# Patient Record
Sex: Female | Born: 1970
Health system: Southern US, Community
[De-identification: ages and names within clinical notes are randomized; demographics above are authoritative.]

---

## 1998-06-08 ENCOUNTER — Ambulatory Visit (HOSPITAL_COMMUNITY): Admission: RE | Admit: 1998-06-08 | Discharge: 1998-06-08 | Payer: Self-pay | Admitting: Family Medicine

## 1999-05-25 ENCOUNTER — Other Ambulatory Visit: Admission: RE | Admit: 1999-05-25 | Discharge: 1999-05-25 | Payer: Self-pay | Admitting: *Deleted

## 2000-05-28 ENCOUNTER — Other Ambulatory Visit: Admission: RE | Admit: 2000-05-28 | Discharge: 2000-05-28 | Payer: Self-pay | Admitting: *Deleted

## 2001-06-11 ENCOUNTER — Other Ambulatory Visit: Admission: RE | Admit: 2001-06-11 | Discharge: 2001-06-11 | Payer: Self-pay | Admitting: *Deleted

## 2001-08-15 ENCOUNTER — Ambulatory Visit (HOSPITAL_COMMUNITY): Admission: RE | Admit: 2001-08-15 | Discharge: 2001-08-15 | Payer: Self-pay | Admitting: Obstetrics and Gynecology

## 2001-08-15 ENCOUNTER — Encounter: Payer: Self-pay | Admitting: Obstetrics and Gynecology

## 2002-07-21 ENCOUNTER — Encounter: Payer: Self-pay | Admitting: *Deleted

## 2002-07-21 ENCOUNTER — Encounter: Admission: RE | Admit: 2002-07-21 | Discharge: 2002-07-21 | Payer: Self-pay | Admitting: *Deleted

## 2003-06-17 ENCOUNTER — Other Ambulatory Visit: Admission: RE | Admit: 2003-06-17 | Discharge: 2003-06-17 | Payer: Self-pay | Admitting: *Deleted

## 2003-10-01 ENCOUNTER — Emergency Department (HOSPITAL_COMMUNITY): Admission: EM | Admit: 2003-10-01 | Discharge: 2003-10-01 | Payer: Self-pay | Admitting: Emergency Medicine

## 2003-11-30 ENCOUNTER — Ambulatory Visit (HOSPITAL_COMMUNITY): Admission: RE | Admit: 2003-11-30 | Discharge: 2003-11-30 | Payer: Self-pay | Admitting: Neurology

## 2004-07-12 ENCOUNTER — Other Ambulatory Visit: Admission: RE | Admit: 2004-07-12 | Discharge: 2004-07-12 | Payer: Self-pay | Admitting: *Deleted

## 2005-07-25 ENCOUNTER — Other Ambulatory Visit: Admission: RE | Admit: 2005-07-25 | Discharge: 2005-07-25 | Payer: Self-pay | Admitting: Family Medicine

## 2006-09-05 ENCOUNTER — Other Ambulatory Visit: Admission: RE | Admit: 2006-09-05 | Discharge: 2006-09-05 | Payer: Self-pay | Admitting: Family Medicine

## 2007-09-15 ENCOUNTER — Other Ambulatory Visit: Admission: RE | Admit: 2007-09-15 | Discharge: 2007-09-15 | Payer: Self-pay | Admitting: Family Medicine

## 2007-10-19 ENCOUNTER — Encounter: Admission: RE | Admit: 2007-10-19 | Discharge: 2007-10-19 | Payer: Self-pay | Admitting: Neurology

## 2008-09-23 ENCOUNTER — Other Ambulatory Visit: Admission: RE | Admit: 2008-09-23 | Discharge: 2008-09-23 | Payer: Self-pay | Admitting: Family Medicine

## 2009-10-04 ENCOUNTER — Other Ambulatory Visit: Admission: RE | Admit: 2009-10-04 | Discharge: 2009-10-04 | Payer: Self-pay | Admitting: Family Medicine

## 2010-01-07 ENCOUNTER — Ambulatory Visit (HOSPITAL_BASED_OUTPATIENT_CLINIC_OR_DEPARTMENT_OTHER): Admission: RE | Admit: 2010-01-07 | Discharge: 2010-01-07 | Payer: Self-pay | Admitting: Neurology

## 2010-01-07 ENCOUNTER — Ambulatory Visit: Payer: Self-pay | Admitting: Diagnostic Radiology

## 2010-09-20 ENCOUNTER — Other Ambulatory Visit: Payer: Self-pay | Admitting: Dermatology

## 2011-10-16 ENCOUNTER — Other Ambulatory Visit: Payer: Self-pay | Admitting: Family Medicine

## 2011-10-16 ENCOUNTER — Other Ambulatory Visit (HOSPITAL_COMMUNITY)
Admission: RE | Admit: 2011-10-16 | Discharge: 2011-10-16 | Disposition: A | Discharge status: Home / Self Care | Payer: BC Managed Care – PPO | Source: Ambulatory Visit | Attending: Family Medicine | Admitting: Family Medicine

## 2011-10-16 DIAGNOSIS — Z124 Encounter for screening for malignant neoplasm of cervix: Secondary | ICD-10-CM | POA: Insufficient documentation

## 2011-10-16 DIAGNOSIS — Z1151 Encounter for screening for human papillomavirus (HPV): Secondary | ICD-10-CM | POA: Insufficient documentation

## 2012-09-01 ENCOUNTER — Other Ambulatory Visit: Payer: Self-pay | Admitting: Dermatology

## 2012-11-06 ENCOUNTER — Other Ambulatory Visit: Payer: Self-pay | Admitting: Dermatology

## 2013-05-11 ENCOUNTER — Other Ambulatory Visit: Payer: Self-pay

## 2013-05-11 DIAGNOSIS — Z1231 Encounter for screening mammogram for malignant neoplasm of breast: Secondary | ICD-10-CM

## 2013-05-29 ENCOUNTER — Ambulatory Visit
Admission: RE | Admit: 2013-05-29 | Discharge: 2013-05-29 | Disposition: A | Discharge status: Home / Self Care | Payer: BC Managed Care – PPO | Source: Ambulatory Visit

## 2013-05-29 DIAGNOSIS — Z1231 Encounter for screening mammogram for malignant neoplasm of breast: Secondary | ICD-10-CM

## 2014-07-20 ENCOUNTER — Other Ambulatory Visit: Payer: Self-pay

## 2014-07-20 DIAGNOSIS — Z1231 Encounter for screening mammogram for malignant neoplasm of breast: Secondary | ICD-10-CM

## 2014-07-28 ENCOUNTER — Ambulatory Visit
Admission: RE | Admit: 2014-07-28 | Discharge: 2014-07-28 | Disposition: A | Discharge status: Home / Self Care | Payer: BLUE CROSS/BLUE SHIELD | Source: Ambulatory Visit

## 2014-07-28 DIAGNOSIS — Z1231 Encounter for screening mammogram for malignant neoplasm of breast: Secondary | ICD-10-CM

## 2015-06-20 ENCOUNTER — Other Ambulatory Visit: Payer: Self-pay

## 2015-06-20 DIAGNOSIS — Z1231 Encounter for screening mammogram for malignant neoplasm of breast: Secondary | ICD-10-CM

## 2015-07-29 ENCOUNTER — Ambulatory Visit
Admission: RE | Admit: 2015-07-29 | Discharge: 2015-07-29 | Disposition: A | Discharge status: Home / Self Care | Payer: BLUE CROSS/BLUE SHIELD | Source: Ambulatory Visit

## 2015-07-29 DIAGNOSIS — Z1231 Encounter for screening mammogram for malignant neoplasm of breast: Secondary | ICD-10-CM | POA: Diagnosis not present

## 2015-08-02 ENCOUNTER — Other Ambulatory Visit: Payer: Self-pay | Admitting: Family Medicine

## 2015-08-02 DIAGNOSIS — R928 Other abnormal and inconclusive findings on diagnostic imaging of breast: Secondary | ICD-10-CM

## 2015-08-10 ENCOUNTER — Ambulatory Visit
Admission: RE | Admit: 2015-08-10 | Discharge: 2015-08-10 | Disposition: A | Discharge status: Home / Self Care | Payer: BLUE CROSS/BLUE SHIELD | Source: Ambulatory Visit | Attending: Family Medicine | Admitting: Family Medicine

## 2015-08-10 DIAGNOSIS — R928 Other abnormal and inconclusive findings on diagnostic imaging of breast: Secondary | ICD-10-CM

## 2015-08-10 DIAGNOSIS — F430 Acute stress reaction: Secondary | ICD-10-CM | POA: Diagnosis not present

## 2015-09-07 DIAGNOSIS — R42 Dizziness and giddiness: Secondary | ICD-10-CM | POA: Diagnosis not present

## 2015-09-27 DIAGNOSIS — C713 Malignant neoplasm of parietal lobe: Secondary | ICD-10-CM | POA: Diagnosis not present

## 2015-10-03 DIAGNOSIS — C713 Malignant neoplasm of parietal lobe: Secondary | ICD-10-CM | POA: Diagnosis not present

## 2016-01-25 DIAGNOSIS — H5213 Myopia, bilateral: Secondary | ICD-10-CM | POA: Diagnosis not present

## 2016-02-07 DIAGNOSIS — E559 Vitamin D deficiency, unspecified: Secondary | ICD-10-CM | POA: Diagnosis not present

## 2016-02-07 DIAGNOSIS — Z0000 Encounter for general adult medical examination without abnormal findings: Secondary | ICD-10-CM | POA: Diagnosis not present

## 2016-02-15 DIAGNOSIS — Z0000 Encounter for general adult medical examination without abnormal findings: Secondary | ICD-10-CM | POA: Diagnosis not present

## 2016-02-29 DIAGNOSIS — D2261 Melanocytic nevi of right upper limb, including shoulder: Secondary | ICD-10-CM | POA: Diagnosis not present

## 2016-02-29 DIAGNOSIS — L821 Other seborrheic keratosis: Secondary | ICD-10-CM | POA: Diagnosis not present

## 2016-02-29 DIAGNOSIS — D225 Melanocytic nevi of trunk: Secondary | ICD-10-CM | POA: Diagnosis not present

## 2016-02-29 DIAGNOSIS — D224 Melanocytic nevi of scalp and neck: Secondary | ICD-10-CM | POA: Diagnosis not present

## 2016-05-14 DIAGNOSIS — L650 Telogen effluvium: Secondary | ICD-10-CM | POA: Diagnosis not present

## 2016-05-14 DIAGNOSIS — E559 Vitamin D deficiency, unspecified: Secondary | ICD-10-CM | POA: Diagnosis not present

## 2016-08-01 DIAGNOSIS — R350 Frequency of micturition: Secondary | ICD-10-CM | POA: Diagnosis not present

## 2016-08-13 ENCOUNTER — Other Ambulatory Visit: Payer: Self-pay | Admitting: Family Medicine

## 2016-08-13 DIAGNOSIS — Z1231 Encounter for screening mammogram for malignant neoplasm of breast: Secondary | ICD-10-CM

## 2016-08-24 ENCOUNTER — Ambulatory Visit
Admission: RE | Admit: 2016-08-24 | Discharge: 2016-08-24 | Disposition: A | Discharge status: Home / Self Care | Payer: BLUE CROSS/BLUE SHIELD | Source: Ambulatory Visit | Attending: Family Medicine | Admitting: Family Medicine

## 2016-08-24 DIAGNOSIS — Z1231 Encounter for screening mammogram for malignant neoplasm of breast: Secondary | ICD-10-CM

## 2016-09-17 DIAGNOSIS — C713 Malignant neoplasm of parietal lobe: Secondary | ICD-10-CM | POA: Diagnosis not present

## 2016-10-01 DIAGNOSIS — C713 Malignant neoplasm of parietal lobe: Secondary | ICD-10-CM | POA: Diagnosis not present

## 2016-11-09 DIAGNOSIS — N390 Urinary tract infection, site not specified: Secondary | ICD-10-CM | POA: Diagnosis not present

## 2016-11-09 DIAGNOSIS — R300 Dysuria: Secondary | ICD-10-CM | POA: Diagnosis not present

## 2016-11-15 DIAGNOSIS — R350 Frequency of micturition: Secondary | ICD-10-CM | POA: Diagnosis not present

## 2017-01-08 DIAGNOSIS — R109 Unspecified abdominal pain: Secondary | ICD-10-CM | POA: Diagnosis not present

## 2017-01-08 DIAGNOSIS — R110 Nausea: Secondary | ICD-10-CM | POA: Diagnosis not present

## 2017-02-04 DIAGNOSIS — H5213 Myopia, bilateral: Secondary | ICD-10-CM | POA: Diagnosis not present

## 2017-03-15 DIAGNOSIS — R0609 Other forms of dyspnea: Secondary | ICD-10-CM | POA: Diagnosis not present

## 2017-03-15 DIAGNOSIS — R1012 Left upper quadrant pain: Secondary | ICD-10-CM | POA: Diagnosis not present

## 2017-03-18 ENCOUNTER — Telehealth: Payer: Self-pay

## 2017-03-18 NOTE — Telephone Encounter (Signed)
NOTES ON FILE 

## 2017-03-19 ENCOUNTER — Telehealth: Payer: Self-pay

## 2017-03-19 NOTE — Telephone Encounter (Signed)
SENT REFERRAL TO SCHEDULING 

## 2017-04-15 ENCOUNTER — Other Ambulatory Visit: Payer: Self-pay | Admitting: Family Medicine

## 2017-04-15 ENCOUNTER — Other Ambulatory Visit (HOSPITAL_COMMUNITY)
Admission: RE | Admit: 2017-04-15 | Discharge: 2017-04-15 | Disposition: A | Discharge status: Home / Self Care | Payer: BLUE CROSS/BLUE SHIELD | Source: Ambulatory Visit | Attending: Family Medicine | Admitting: Family Medicine

## 2017-04-15 DIAGNOSIS — E559 Vitamin D deficiency, unspecified: Secondary | ICD-10-CM | POA: Diagnosis not present

## 2017-04-15 DIAGNOSIS — Z124 Encounter for screening for malignant neoplasm of cervix: Secondary | ICD-10-CM | POA: Diagnosis not present

## 2017-04-15 DIAGNOSIS — Z0000 Encounter for general adult medical examination without abnormal findings: Secondary | ICD-10-CM | POA: Diagnosis not present

## 2017-04-15 DIAGNOSIS — R790 Abnormal level of blood mineral: Secondary | ICD-10-CM | POA: Diagnosis not present

## 2017-04-15 DIAGNOSIS — D473 Essential (hemorrhagic) thrombocythemia: Secondary | ICD-10-CM | POA: Diagnosis not present

## 2017-04-17 LAB — CYTOLOGY - PAP
Diagnosis: NEGATIVE
HPV (WINDOPATH): NOT DETECTED

## 2017-05-01 DIAGNOSIS — D225 Melanocytic nevi of trunk: Secondary | ICD-10-CM | POA: Diagnosis not present

## 2017-05-01 DIAGNOSIS — D2262 Melanocytic nevi of left upper limb, including shoulder: Secondary | ICD-10-CM | POA: Diagnosis not present

## 2017-05-01 DIAGNOSIS — L821 Other seborrheic keratosis: Secondary | ICD-10-CM | POA: Diagnosis not present

## 2017-05-01 DIAGNOSIS — L738 Other specified follicular disorders: Secondary | ICD-10-CM | POA: Diagnosis not present

## 2017-05-02 ENCOUNTER — Ambulatory Visit: Payer: BLUE CROSS/BLUE SHIELD | Admitting: Cardiology

## 2017-06-21 DIAGNOSIS — K648 Other hemorrhoids: Secondary | ICD-10-CM | POA: Diagnosis not present

## 2017-06-21 DIAGNOSIS — N3001 Acute cystitis with hematuria: Secondary | ICD-10-CM | POA: Diagnosis not present

## 2017-06-21 DIAGNOSIS — R300 Dysuria: Secondary | ICD-10-CM | POA: Diagnosis not present

## 2017-06-21 DIAGNOSIS — R350 Frequency of micturition: Secondary | ICD-10-CM | POA: Diagnosis not present

## 2017-08-26 ENCOUNTER — Other Ambulatory Visit: Payer: Self-pay | Admitting: Family Medicine

## 2017-08-26 DIAGNOSIS — Z1231 Encounter for screening mammogram for malignant neoplasm of breast: Secondary | ICD-10-CM

## 2017-09-20 DIAGNOSIS — C713 Malignant neoplasm of parietal lobe: Secondary | ICD-10-CM | POA: Diagnosis not present

## 2017-09-30 DIAGNOSIS — C713 Malignant neoplasm of parietal lobe: Secondary | ICD-10-CM | POA: Diagnosis not present

## 2017-10-07 ENCOUNTER — Ambulatory Visit: Payer: BLUE CROSS/BLUE SHIELD

## 2017-10-30 ENCOUNTER — Ambulatory Visit
Admission: RE | Admit: 2017-10-30 | Discharge: 2017-10-30 | Disposition: A | Discharge status: Home / Self Care | Payer: BLUE CROSS/BLUE SHIELD | Source: Ambulatory Visit | Attending: Family Medicine | Admitting: Family Medicine

## 2017-10-30 DIAGNOSIS — Z1231 Encounter for screening mammogram for malignant neoplasm of breast: Secondary | ICD-10-CM | POA: Diagnosis not present

## 2018-01-28 DIAGNOSIS — R300 Dysuria: Secondary | ICD-10-CM | POA: Diagnosis not present

## 2018-02-19 DIAGNOSIS — H5213 Myopia, bilateral: Secondary | ICD-10-CM | POA: Diagnosis not present

## 2018-05-05 DIAGNOSIS — E559 Vitamin D deficiency, unspecified: Secondary | ICD-10-CM | POA: Diagnosis not present

## 2018-05-05 DIAGNOSIS — Z131 Encounter for screening for diabetes mellitus: Secondary | ICD-10-CM | POA: Diagnosis not present

## 2018-05-05 DIAGNOSIS — R790 Abnormal level of blood mineral: Secondary | ICD-10-CM | POA: Diagnosis not present

## 2018-05-05 DIAGNOSIS — D473 Essential (hemorrhagic) thrombocythemia: Secondary | ICD-10-CM | POA: Diagnosis not present

## 2018-05-05 DIAGNOSIS — Z13220 Encounter for screening for lipoid disorders: Secondary | ICD-10-CM | POA: Diagnosis not present

## 2018-05-05 DIAGNOSIS — Z0000 Encounter for general adult medical examination without abnormal findings: Secondary | ICD-10-CM | POA: Diagnosis not present

## 2018-09-25 DIAGNOSIS — C713 Malignant neoplasm of parietal lobe: Secondary | ICD-10-CM | POA: Diagnosis not present

## 2018-09-29 DIAGNOSIS — C713 Malignant neoplasm of parietal lobe: Secondary | ICD-10-CM | POA: Diagnosis not present

## 2018-11-24 ENCOUNTER — Other Ambulatory Visit: Payer: Self-pay | Admitting: Family Medicine

## 2018-11-24 DIAGNOSIS — Z1231 Encounter for screening mammogram for malignant neoplasm of breast: Secondary | ICD-10-CM

## 2018-12-21 DIAGNOSIS — N390 Urinary tract infection, site not specified: Secondary | ICD-10-CM | POA: Diagnosis not present

## 2018-12-30 DIAGNOSIS — R300 Dysuria: Secondary | ICD-10-CM | POA: Diagnosis not present

## 2018-12-30 DIAGNOSIS — N390 Urinary tract infection, site not specified: Secondary | ICD-10-CM | POA: Diagnosis not present

## 2019-01-02 ENCOUNTER — Other Ambulatory Visit: Payer: Self-pay

## 2019-01-02 ENCOUNTER — Ambulatory Visit
Admission: RE | Admit: 2019-01-02 | Discharge: 2019-01-02 | Disposition: A | Discharge status: Home / Self Care | Payer: BC Managed Care – PPO | Source: Ambulatory Visit | Attending: Family Medicine | Admitting: Family Medicine

## 2019-01-02 DIAGNOSIS — Z1231 Encounter for screening mammogram for malignant neoplasm of breast: Secondary | ICD-10-CM

## 2019-01-08 DIAGNOSIS — Z87442 Personal history of urinary calculi: Secondary | ICD-10-CM | POA: Diagnosis not present

## 2019-01-08 DIAGNOSIS — N3000 Acute cystitis without hematuria: Secondary | ICD-10-CM | POA: Diagnosis not present

## 2019-01-08 DIAGNOSIS — R310 Gross hematuria: Secondary | ICD-10-CM | POA: Diagnosis not present

## 2019-01-16 DIAGNOSIS — R310 Gross hematuria: Secondary | ICD-10-CM | POA: Diagnosis not present

## 2019-01-19 DIAGNOSIS — R310 Gross hematuria: Secondary | ICD-10-CM | POA: Diagnosis not present

## 2019-01-19 DIAGNOSIS — N3000 Acute cystitis without hematuria: Secondary | ICD-10-CM | POA: Diagnosis not present

## 2019-02-23 DIAGNOSIS — H5213 Myopia, bilateral: Secondary | ICD-10-CM | POA: Diagnosis not present

## 2019-03-18 DIAGNOSIS — Z20822 Contact with and (suspected) exposure to covid-19: Secondary | ICD-10-CM | POA: Diagnosis not present

## 2019-04-03 DIAGNOSIS — D2261 Melanocytic nevi of right upper limb, including shoulder: Secondary | ICD-10-CM | POA: Diagnosis not present

## 2019-04-03 DIAGNOSIS — D2262 Melanocytic nevi of left upper limb, including shoulder: Secondary | ICD-10-CM | POA: Diagnosis not present

## 2019-04-03 DIAGNOSIS — L821 Other seborrheic keratosis: Secondary | ICD-10-CM | POA: Diagnosis not present

## 2019-04-03 DIAGNOSIS — D225 Melanocytic nevi of trunk: Secondary | ICD-10-CM | POA: Diagnosis not present

## 2019-04-29 DIAGNOSIS — Z6826 Body mass index (BMI) 26.0-26.9, adult: Secondary | ICD-10-CM | POA: Diagnosis not present

## 2019-04-29 DIAGNOSIS — Z01419 Encounter for gynecological examination (general) (routine) without abnormal findings: Secondary | ICD-10-CM | POA: Diagnosis not present

## 2019-04-29 DIAGNOSIS — N951 Menopausal and female climacteric states: Secondary | ICD-10-CM | POA: Diagnosis not present

## 2019-05-13 DIAGNOSIS — Z0000 Encounter for general adult medical examination without abnormal findings: Secondary | ICD-10-CM | POA: Diagnosis not present

## 2019-05-13 DIAGNOSIS — Z23 Encounter for immunization: Secondary | ICD-10-CM | POA: Diagnosis not present

## 2019-05-13 DIAGNOSIS — R790 Abnormal level of blood mineral: Secondary | ICD-10-CM | POA: Diagnosis not present

## 2019-05-13 DIAGNOSIS — Z13220 Encounter for screening for lipoid disorders: Secondary | ICD-10-CM | POA: Diagnosis not present

## 2019-05-13 DIAGNOSIS — E559 Vitamin D deficiency, unspecified: Secondary | ICD-10-CM | POA: Diagnosis not present

## 2019-06-12 ENCOUNTER — Ambulatory Visit: Payer: Self-pay | Attending: Internal Medicine

## 2019-06-12 DIAGNOSIS — Z23 Encounter for immunization: Secondary | ICD-10-CM

## 2019-06-12 NOTE — Progress Notes (Signed)
   Covid-19 Vaccination Clinic  Name:  Theresa King    MRN: [id number] DOB: [birth year]  06/12/2019  [name] was observed post Covid-19 immunization for 15 minutes without incident. She was provided with Vaccine Information Sheet and instruction to access the V-Safe system.   [name] was instructed to call 911 with any severe reactions post vaccine: Marland Kitchen Difficulty breathing  . Swelling of face and throat  . A fast heartbeat  . A bad rash all over body  . Dizziness and weakness   Immunizations Administered    Name Date Dose VIS Date Route   Pfizer COVID-19 Vaccine 06/12/2019 11:53 AM 0.3 mL 02/20/2019 Intramuscular   Manufacturer: [place]   Lot: [id number]   [place]: [id number]

## 2019-07-07 ENCOUNTER — Ambulatory Visit: Payer: Self-pay

## 2019-07-07 ENCOUNTER — Ambulatory Visit: Payer: Self-pay | Attending: Internal Medicine

## 2019-07-07 DIAGNOSIS — Z23 Encounter for immunization: Secondary | ICD-10-CM

## 2019-07-07 NOTE — Progress Notes (Signed)
   Covid-19 Vaccination Clinic  Name:  Theresa King    MRN: [id number] DOB: [birth year]  07/07/2019  [name] was observed post Covid-19 immunization for 15 minutes without incident. She was provided with Vaccine Information Sheet and instruction to access the V-Safe system.   [name] was instructed to call 911 with any severe reactions post vaccine: Marland Kitchen Difficulty breathing  . Swelling of face and throat  . A fast heartbeat  . A bad rash all over body  . Dizziness and weakness   Immunizations Administered    Name Date Dose VIS Date Route   Pfizer COVID-19 Vaccine 07/07/2019  4:48 PM 0.3 mL 05/06/2018 Intramuscular   Manufacturer: [place]   Lot: [id number]   [place]: [id number]

## 2019-10-12 DIAGNOSIS — Z9889 Other specified postprocedural states: Secondary | ICD-10-CM | POA: Diagnosis not present

## 2019-10-12 DIAGNOSIS — C713 Malignant neoplasm of parietal lobe: Secondary | ICD-10-CM | POA: Diagnosis not present

## 2019-10-26 DIAGNOSIS — C713 Malignant neoplasm of parietal lobe: Secondary | ICD-10-CM | POA: Diagnosis not present

## 2020-01-19 ENCOUNTER — Other Ambulatory Visit: Payer: Self-pay | Admitting: Family Medicine

## 2020-01-19 DIAGNOSIS — Z1231 Encounter for screening mammogram for malignant neoplasm of breast: Secondary | ICD-10-CM

## 2020-01-20 ENCOUNTER — Ambulatory Visit
Admission: RE | Admit: 2020-01-20 | Discharge: 2020-01-20 | Disposition: A | Discharge status: Home / Self Care | Payer: BC Managed Care – PPO | Source: Ambulatory Visit | Attending: Family Medicine | Admitting: Family Medicine

## 2020-01-20 ENCOUNTER — Other Ambulatory Visit: Payer: Self-pay

## 2020-01-20 DIAGNOSIS — Z1231 Encounter for screening mammogram for malignant neoplasm of breast: Secondary | ICD-10-CM | POA: Diagnosis not present

## 2020-02-13 ENCOUNTER — Ambulatory Visit: Payer: BC Managed Care – PPO | Attending: Internal Medicine

## 2020-02-13 DIAGNOSIS — Z23 Encounter for immunization: Secondary | ICD-10-CM

## 2020-02-13 NOTE — Progress Notes (Signed)
   Covid-19 Vaccination Clinic  Name:  Theresa King    MRN: [id number] DOB: 25-Nov-1970  02/13/2020  [name] was observed post Covid-19 immunization for 15 minutes without incident. She was provided with Vaccine Information Sheet and instruction to access the V-Safe system.   [name] was instructed to call 911 with any severe reactions post vaccine: Marland Kitchen Difficulty breathing  . Swelling of face and throat  . A fast heartbeat  . A bad rash all over body  . Dizziness and weakness   Immunizations Administered    Name Date Dose VIS Date Route   Pfizer COVID-19 Vaccine 02/13/2020 12:10 PM 0.3 mL 12/30/2019 Intramuscular   Manufacturer: [place]   Lot: [id number]   NDC: [address]-6

## 2020-04-13 DIAGNOSIS — D2262 Melanocytic nevi of left upper limb, including shoulder: Secondary | ICD-10-CM | POA: Diagnosis not present

## 2020-04-13 DIAGNOSIS — D2261 Melanocytic nevi of right upper limb, including shoulder: Secondary | ICD-10-CM | POA: Diagnosis not present

## 2020-04-13 DIAGNOSIS — D2221 Melanocytic nevi of right ear and external auricular canal: Secondary | ICD-10-CM | POA: Diagnosis not present

## 2020-04-13 DIAGNOSIS — L821 Other seborrheic keratosis: Secondary | ICD-10-CM | POA: Diagnosis not present

## 2020-04-26 DIAGNOSIS — H5213 Myopia, bilateral: Secondary | ICD-10-CM | POA: Diagnosis not present

## 2020-05-23 DIAGNOSIS — E663 Overweight: Secondary | ICD-10-CM | POA: Diagnosis not present

## 2020-05-23 DIAGNOSIS — Z13220 Encounter for screening for lipoid disorders: Secondary | ICD-10-CM | POA: Diagnosis not present

## 2020-05-23 DIAGNOSIS — Z0000 Encounter for general adult medical examination without abnormal findings: Secondary | ICD-10-CM | POA: Diagnosis not present

## 2020-05-23 DIAGNOSIS — E559 Vitamin D deficiency, unspecified: Secondary | ICD-10-CM | POA: Diagnosis not present

## 2020-05-23 DIAGNOSIS — Z131 Encounter for screening for diabetes mellitus: Secondary | ICD-10-CM | POA: Diagnosis not present

## 2020-10-20 DIAGNOSIS — Z9889 Other specified postprocedural states: Secondary | ICD-10-CM | POA: Diagnosis not present

## 2020-10-20 DIAGNOSIS — C713 Malignant neoplasm of parietal lobe: Secondary | ICD-10-CM | POA: Diagnosis not present

## 2020-10-24 DIAGNOSIS — C713 Malignant neoplasm of parietal lobe: Secondary | ICD-10-CM | POA: Diagnosis not present

## 2020-12-01 IMAGING — MG DIGITAL SCREENING BILAT W/ TOMO W/ CAD
8 series · 8 of 24 positions shown · non-contrast
Comparison: Previous exam(s).

CLINICAL DATA: Screening.

EXAM:
DIGITAL SCREENING BILATERAL MAMMOGRAM WITH TOMO AND CAD

[L MLO synth-2D]
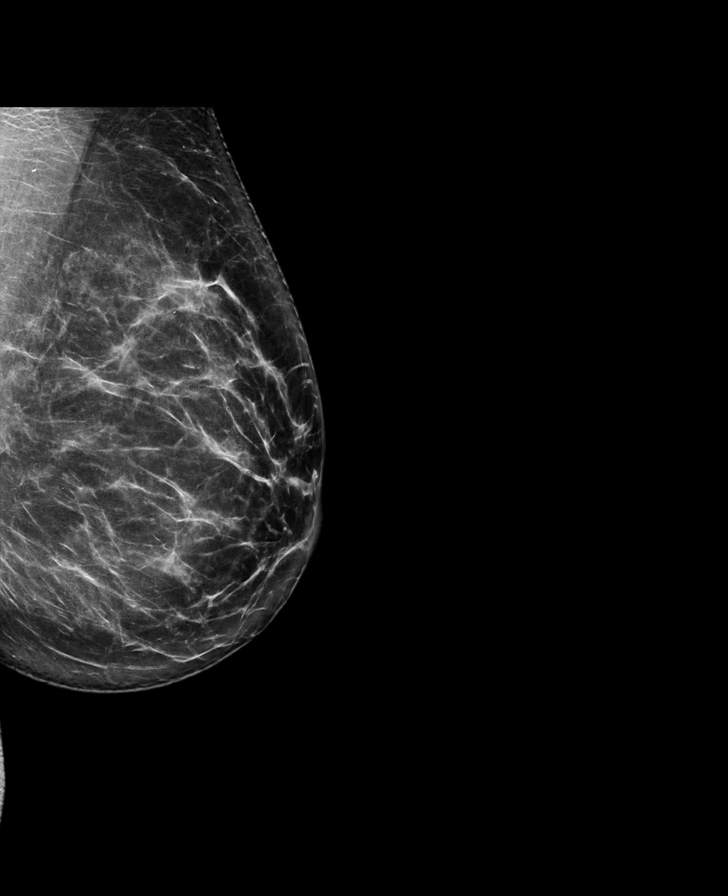

[R CC synth-2D]
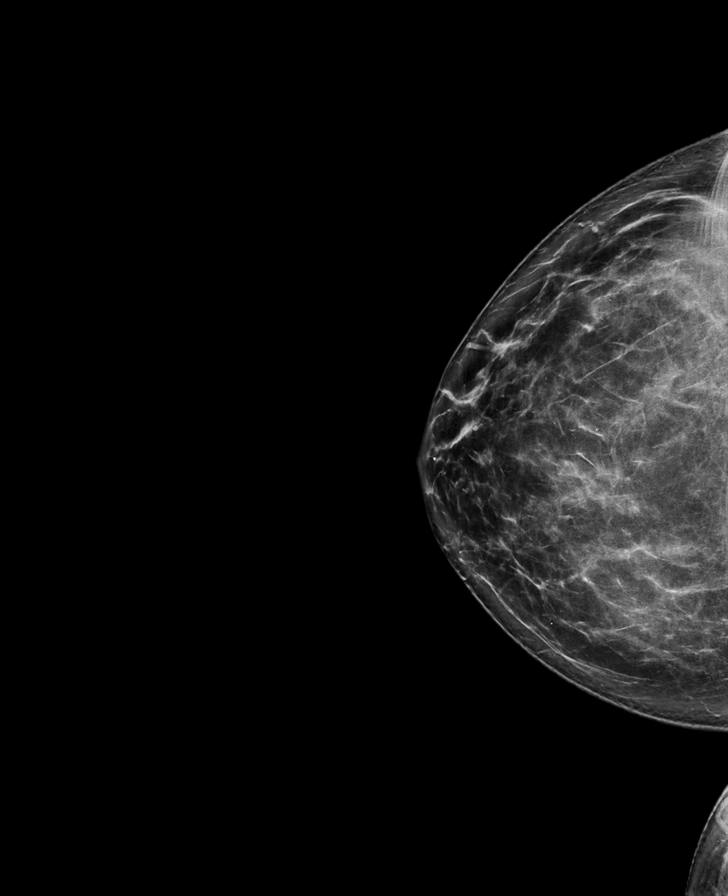

[R MLO synth-2D]
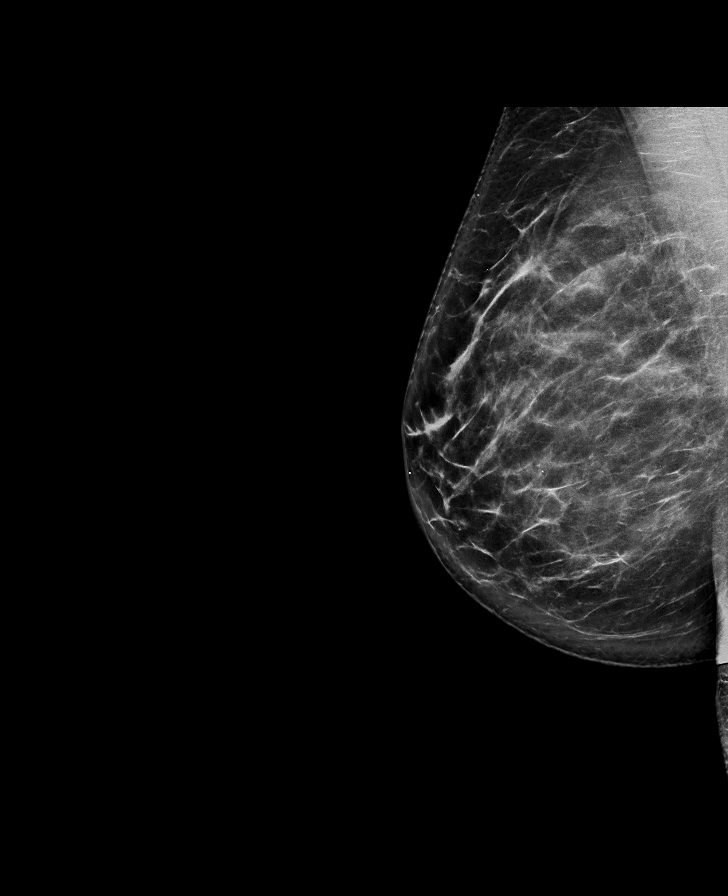

[L CC synth-2D]
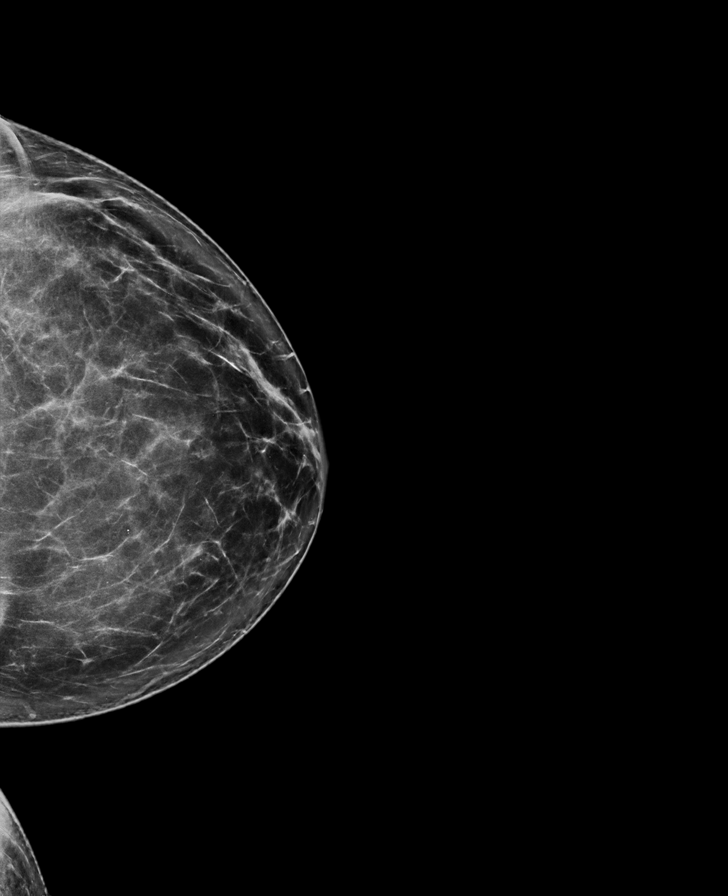

[R MLO tomo · tomo slice 47/93.0]
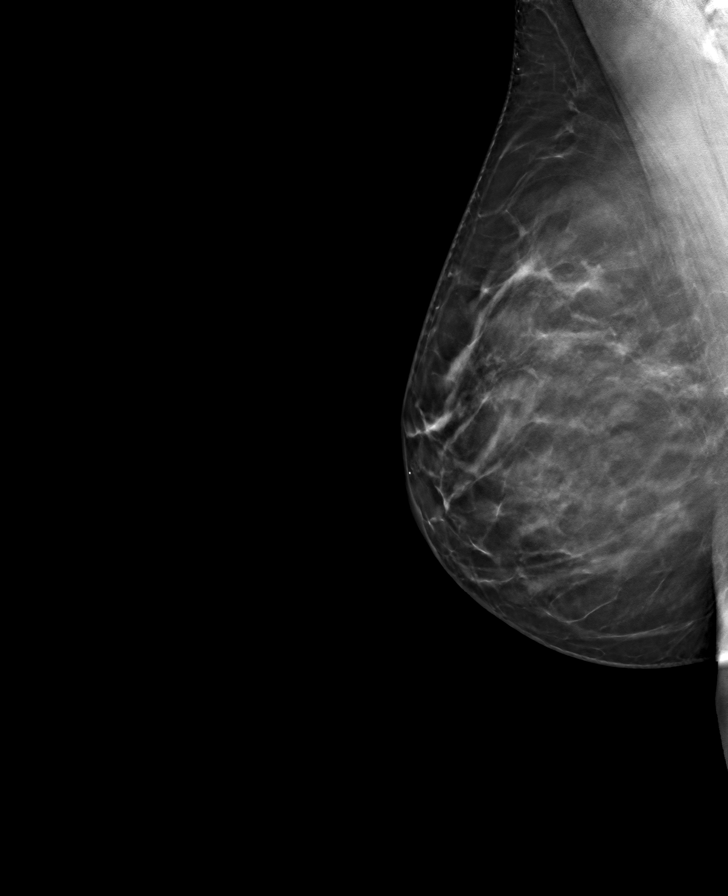

[L CC tomo · tomo slice 45/90.0]
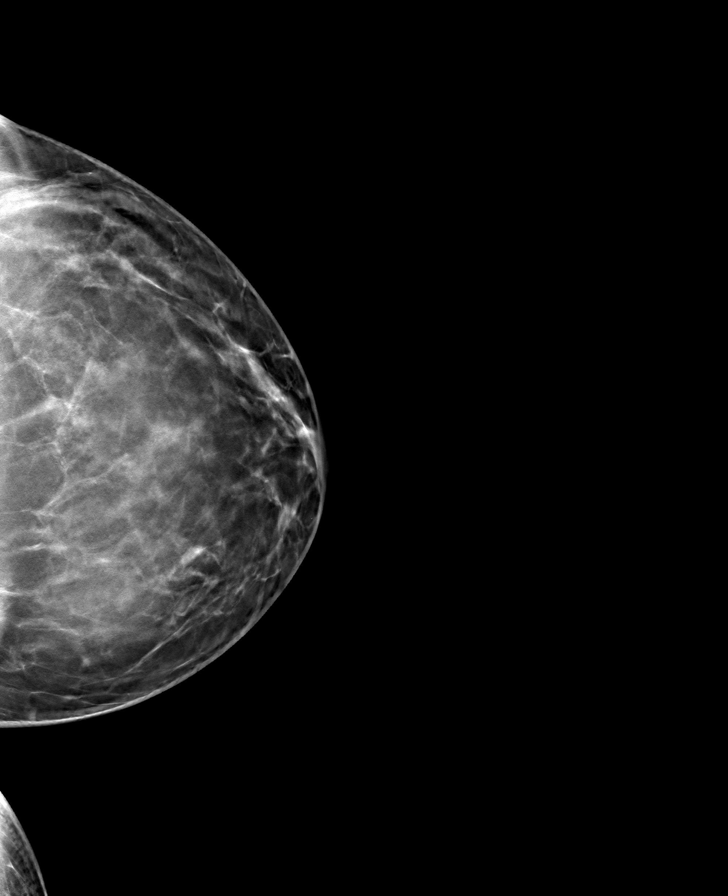

[R CC tomo · tomo slice 45/90.0]
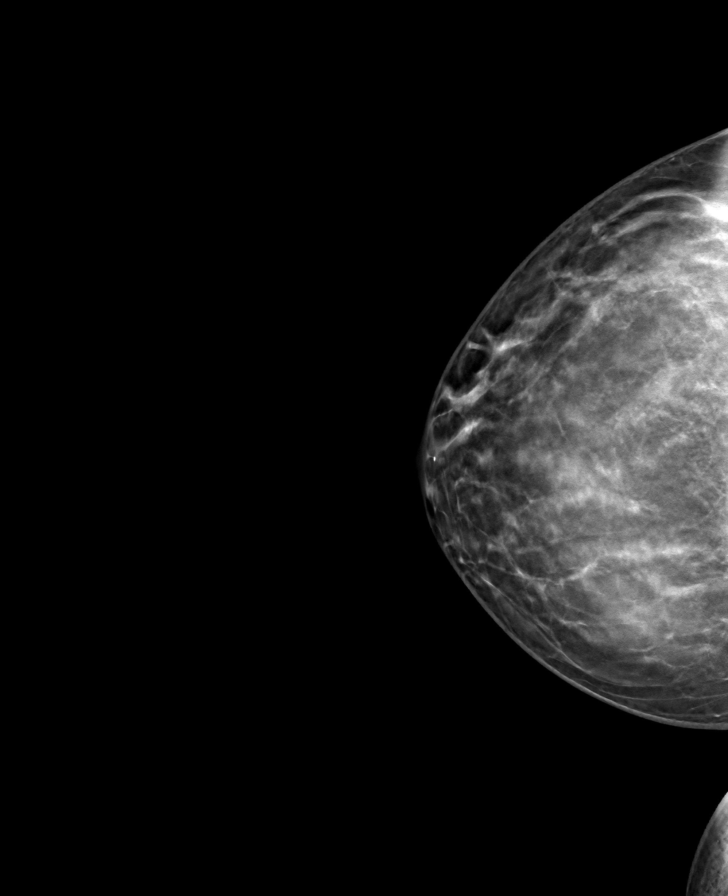

[L MLO tomo · tomo slice 45/88.0]
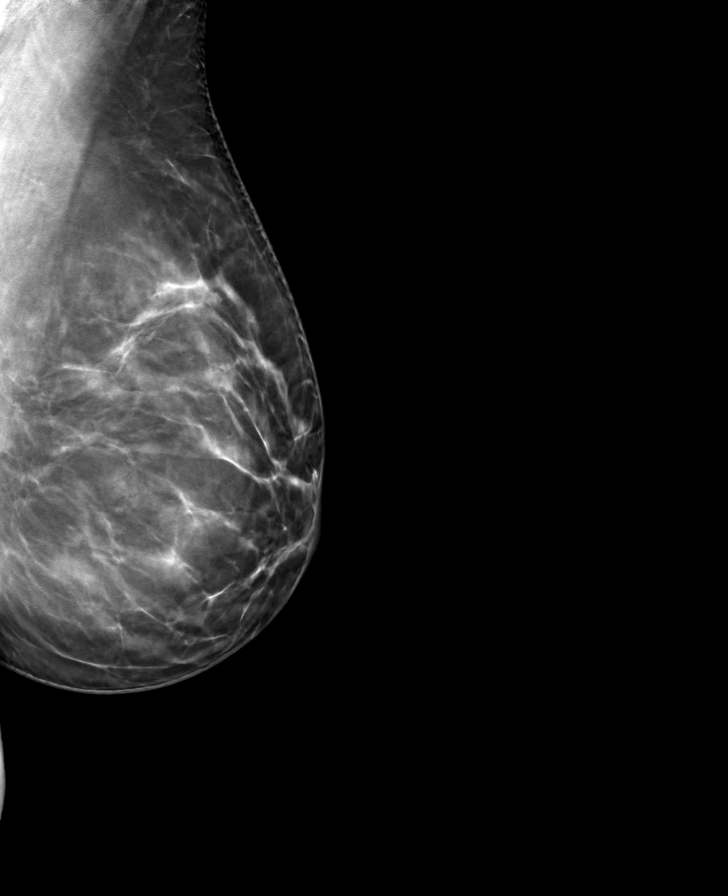

[8 of 24 positions shown; findings below may reference images not displayed]

ACR Breast Density Category c: The breast tissue is heterogeneously
dense, which may obscure small masses.
FINDINGS: There are no findings suspicious for malignancy. Images were
processed with CAD.
IMPRESSION: No mammographic evidence of malignancy. A result letter of this
screening mammogram will be mailed directly to the patient.

RECOMMENDATION:
Screening mammogram in one year. (Code:[id number])

BI-RADS CATEGORY  1: Negative.

## 2020-12-15 ENCOUNTER — Other Ambulatory Visit: Payer: Self-pay | Admitting: Family Medicine

## 2020-12-15 DIAGNOSIS — Z1231 Encounter for screening mammogram for malignant neoplasm of breast: Secondary | ICD-10-CM

## 2021-01-23 ENCOUNTER — Ambulatory Visit
Admission: RE | Admit: 2021-01-23 | Discharge: 2021-01-23 | Disposition: A | Discharge status: Home / Self Care | Payer: BC Managed Care – PPO | Source: Ambulatory Visit | Attending: Family Medicine | Admitting: Family Medicine

## 2021-01-23 DIAGNOSIS — Z1231 Encounter for screening mammogram for malignant neoplasm of breast: Secondary | ICD-10-CM | POA: Diagnosis not present

## 2021-02-06 ENCOUNTER — Ambulatory Visit: Payer: Self-pay

## 2021-02-06 ENCOUNTER — Other Ambulatory Visit: Payer: Self-pay

## 2021-02-06 ENCOUNTER — Ambulatory Visit (INDEPENDENT_AMBULATORY_CARE_PROVIDER_SITE_OTHER): Payer: BC Managed Care – PPO | Admitting: Podiatry

## 2021-02-06 DIAGNOSIS — M722 Plantar fascial fibromatosis: Secondary | ICD-10-CM | POA: Diagnosis not present

## 2021-02-06 DIAGNOSIS — F430 Acute stress reaction: Secondary | ICD-10-CM | POA: Insufficient documentation

## 2021-02-06 DIAGNOSIS — C719 Malignant neoplasm of brain, unspecified: Secondary | ICD-10-CM | POA: Insufficient documentation

## 2021-02-06 DIAGNOSIS — R790 Abnormal level of blood mineral: Secondary | ICD-10-CM | POA: Insufficient documentation

## 2021-02-06 DIAGNOSIS — D75839 Thrombocytosis, unspecified: Secondary | ICD-10-CM | POA: Insufficient documentation

## 2021-02-06 DIAGNOSIS — L650 Telogen effluvium: Secondary | ICD-10-CM | POA: Insufficient documentation

## 2021-02-06 DIAGNOSIS — E559 Vitamin D deficiency, unspecified: Secondary | ICD-10-CM | POA: Insufficient documentation

## 2021-02-06 DIAGNOSIS — N390 Urinary tract infection, site not specified: Secondary | ICD-10-CM | POA: Insufficient documentation

## 2021-02-06 DIAGNOSIS — J301 Allergic rhinitis due to pollen: Secondary | ICD-10-CM | POA: Insufficient documentation

## 2021-02-06 DIAGNOSIS — N200 Calculus of kidney: Secondary | ICD-10-CM | POA: Insufficient documentation

## 2021-02-06 DIAGNOSIS — E663 Overweight: Secondary | ICD-10-CM | POA: Insufficient documentation

## 2021-02-06 MED ORDER — MELOXICAM 15 MG PO TABS: 15.0000 mg | ORAL_TABLET | Freq: Every day | ORAL | 1 refills | Status: AC

## 2021-02-06 MED ORDER — METHYLPREDNISOLONE 4 MG PO TBPK: ORAL_TABLET | ORAL | 0 refills | Status: AC

## 2021-02-06 MED ORDER — BETAMETHASONE SOD PHOS & ACET 6 (3-3) MG/ML IJ SUSP
3.0000 mg | Freq: Once | INTRAMUSCULAR | Status: AC
  Administered 2021-02-06: 11:00:00 3 mg via INTRA_ARTICULAR

## 2021-02-06 NOTE — Progress Notes (Signed)
   Subjective: 50 y.o. female presenting today for evaluation of right heel pain this been going on about 4-5 months now.  Patient states that she did wear a pair of on cloud shoes that may have exacerbated her heel pain.  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  She presents for further treatment and evaluation.  Currently she has not done anything for treatment   No past medical history on file.   Objective: Physical Exam General: The patient is alert and oriented x3 in no acute distress.  Dermatology: Skin is warm, dry and supple bilateral lower extremities. Negative for open lesions or macerations bilateral.   Vascular: Dorsalis Pedis and Posterior Tibial pulses palpable bilateral.  Capillary fill time is immediate to all digits.  Neurological: Epicritic and protective threshold intact bilateral.   Musculoskeletal: Tenderness to palpation to the plantar aspect of the right heel along the plantar fascia. All other joints range of motion within normal limits bilateral. Strength 5/5 in all groups bilateral.   Radiographic exam: Normal osseous mineralization. Joint spaces preserved. No fracture/dislocation/boney destruction. No other soft tissue abnormalities or radiopaque foreign bodies.  Small plantar heel spur noted on lateral view  Assessment: 1. Plantar fasciitis right  Plan of Care:  1. Patient evaluated. Xrays reviewed.   2. Injection of 0.5cc Celestone soluspan injected into the right plantar fascia  3. Rx for Medrol Dose Pack placed 4. Rx for Meloxicam ordered for patient. 5. Plantar fascial band(s) dispensed.  OTC power step insoles were also provided 6. Instructed patient regarding therapies and modalities at home to alleviate symptoms.  7. Return to clinic in 4 weeks.    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 Edrick Kins, DPM    [address], [place] [address]                Office [phone]  Fax [phone]

## 2021-03-20 ENCOUNTER — Ambulatory Visit: Payer: Self-pay | Admitting: Podiatry

## 2021-05-05 DIAGNOSIS — D2262 Melanocytic nevi of left upper limb, including shoulder: Secondary | ICD-10-CM | POA: Diagnosis not present

## 2021-05-05 DIAGNOSIS — D2261 Melanocytic nevi of right upper limb, including shoulder: Secondary | ICD-10-CM | POA: Diagnosis not present

## 2021-05-05 DIAGNOSIS — L821 Other seborrheic keratosis: Secondary | ICD-10-CM | POA: Diagnosis not present

## 2021-05-05 DIAGNOSIS — L700 Acne vulgaris: Secondary | ICD-10-CM | POA: Diagnosis not present

## 2021-05-24 DIAGNOSIS — H3563 Retinal hemorrhage, bilateral: Secondary | ICD-10-CM | POA: Diagnosis not present

## 2021-05-31 DIAGNOSIS — Z0000 Encounter for general adult medical examination without abnormal findings: Secondary | ICD-10-CM | POA: Diagnosis not present

## 2021-05-31 DIAGNOSIS — R7303 Prediabetes: Secondary | ICD-10-CM | POA: Diagnosis not present

## 2021-07-13 DIAGNOSIS — E663 Overweight: Secondary | ICD-10-CM | POA: Diagnosis not present

## 2021-07-13 DIAGNOSIS — R7303 Prediabetes: Secondary | ICD-10-CM | POA: Diagnosis not present

## 2021-10-25 DIAGNOSIS — C713 Malignant neoplasm of parietal lobe: Secondary | ICD-10-CM | POA: Diagnosis not present

## 2021-10-30 DIAGNOSIS — C713 Malignant neoplasm of parietal lobe: Secondary | ICD-10-CM | POA: Diagnosis not present

## 2022-01-01 ENCOUNTER — Other Ambulatory Visit: Payer: Self-pay | Admitting: Family Medicine

## 2022-01-01 DIAGNOSIS — Z1231 Encounter for screening mammogram for malignant neoplasm of breast: Secondary | ICD-10-CM

## 2022-02-16 ENCOUNTER — Ambulatory Visit
Admission: RE | Admit: 2022-02-16 | Discharge: 2022-02-16 | Disposition: A | Discharge status: Home / Self Care | Payer: BC Managed Care – PPO | Source: Ambulatory Visit | Attending: Family Medicine | Admitting: Family Medicine

## 2022-02-16 DIAGNOSIS — Z1231 Encounter for screening mammogram for malignant neoplasm of breast: Secondary | ICD-10-CM | POA: Diagnosis not present

## 2022-06-06 DIAGNOSIS — H3563 Retinal hemorrhage, bilateral: Secondary | ICD-10-CM | POA: Diagnosis not present

## 2022-06-27 DIAGNOSIS — D2262 Melanocytic nevi of left upper limb, including shoulder: Secondary | ICD-10-CM | POA: Diagnosis not present

## 2022-06-27 DIAGNOSIS — D485 Neoplasm of uncertain behavior of skin: Secondary | ICD-10-CM | POA: Diagnosis not present

## 2022-06-27 DIAGNOSIS — L700 Acne vulgaris: Secondary | ICD-10-CM | POA: Diagnosis not present

## 2022-06-27 DIAGNOSIS — D225 Melanocytic nevi of trunk: Secondary | ICD-10-CM | POA: Diagnosis not present

## 2022-06-27 DIAGNOSIS — C44719 Basal cell carcinoma of skin of left lower limb, including hip: Secondary | ICD-10-CM | POA: Diagnosis not present

## 2022-06-27 DIAGNOSIS — D2261 Melanocytic nevi of right upper limb, including shoulder: Secondary | ICD-10-CM | POA: Diagnosis not present

## 2022-07-03 DIAGNOSIS — Z124 Encounter for screening for malignant neoplasm of cervix: Secondary | ICD-10-CM | POA: Diagnosis not present

## 2022-07-03 DIAGNOSIS — Z23 Encounter for immunization: Secondary | ICD-10-CM | POA: Diagnosis not present

## 2022-07-03 DIAGNOSIS — E559 Vitamin D deficiency, unspecified: Secondary | ICD-10-CM | POA: Diagnosis not present

## 2022-07-03 DIAGNOSIS — N951 Menopausal and female climacteric states: Secondary | ICD-10-CM | POA: Diagnosis not present

## 2022-07-03 DIAGNOSIS — R7303 Prediabetes: Secondary | ICD-10-CM | POA: Diagnosis not present

## 2022-07-03 DIAGNOSIS — Z0000 Encounter for general adult medical examination without abnormal findings: Secondary | ICD-10-CM | POA: Diagnosis not present

## 2022-09-05 DIAGNOSIS — Z23 Encounter for immunization: Secondary | ICD-10-CM | POA: Diagnosis not present

## 2022-10-23 DIAGNOSIS — K648 Other hemorrhoids: Secondary | ICD-10-CM | POA: Diagnosis not present

## 2022-10-23 DIAGNOSIS — Z1211 Encounter for screening for malignant neoplasm of colon: Secondary | ICD-10-CM | POA: Diagnosis not present

## 2022-10-23 DIAGNOSIS — D122 Benign neoplasm of ascending colon: Secondary | ICD-10-CM | POA: Diagnosis not present

## 2022-10-23 DIAGNOSIS — D123 Benign neoplasm of transverse colon: Secondary | ICD-10-CM | POA: Diagnosis not present

## 2022-10-23 DIAGNOSIS — K644 Residual hemorrhoidal skin tags: Secondary | ICD-10-CM | POA: Diagnosis not present

## 2023-01-22 ENCOUNTER — Other Ambulatory Visit: Payer: Self-pay | Admitting: Family Medicine

## 2023-01-22 DIAGNOSIS — Z1231 Encounter for screening mammogram for malignant neoplasm of breast: Secondary | ICD-10-CM

## 2023-02-20 ENCOUNTER — Ambulatory Visit: Payer: BC Managed Care – PPO

## 2023-03-01 ENCOUNTER — Ambulatory Visit
Admission: RE | Admit: 2023-03-01 | Discharge: 2023-03-01 | Disposition: A | Discharge status: Home / Self Care | Payer: BC Managed Care – PPO | Source: Ambulatory Visit | Attending: Family Medicine | Admitting: Family Medicine

## 2023-03-01 DIAGNOSIS — Z1231 Encounter for screening mammogram for malignant neoplasm of breast: Secondary | ICD-10-CM | POA: Diagnosis not present

## 2023-04-17 DIAGNOSIS — C713 Malignant neoplasm of parietal lobe: Secondary | ICD-10-CM | POA: Diagnosis not present

## 2023-04-29 DIAGNOSIS — C713 Malignant neoplasm of parietal lobe: Secondary | ICD-10-CM | POA: Diagnosis not present

## 2023-07-24 DIAGNOSIS — H524 Presbyopia: Secondary | ICD-10-CM | POA: Diagnosis not present

## 2023-08-08 DIAGNOSIS — J301 Allergic rhinitis due to pollen: Secondary | ICD-10-CM | POA: Diagnosis not present

## 2023-08-08 DIAGNOSIS — Z86018 Personal history of other benign neoplasm: Secondary | ICD-10-CM | POA: Diagnosis not present

## 2023-08-08 DIAGNOSIS — Z6828 Body mass index (BMI) 28.0-28.9, adult: Secondary | ICD-10-CM | POA: Diagnosis not present

## 2023-08-08 DIAGNOSIS — Z0000 Encounter for general adult medical examination without abnormal findings: Secondary | ICD-10-CM | POA: Diagnosis not present

## 2023-08-08 DIAGNOSIS — E559 Vitamin D deficiency, unspecified: Secondary | ICD-10-CM | POA: Diagnosis not present

## 2023-08-08 DIAGNOSIS — R7303 Prediabetes: Secondary | ICD-10-CM | POA: Diagnosis not present

## 2023-08-08 DIAGNOSIS — C719 Malignant neoplasm of brain, unspecified: Secondary | ICD-10-CM | POA: Diagnosis not present

## 2023-09-02 DIAGNOSIS — L570 Actinic keratosis: Secondary | ICD-10-CM | POA: Diagnosis not present

## 2023-09-02 DIAGNOSIS — D2262 Melanocytic nevi of left upper limb, including shoulder: Secondary | ICD-10-CM | POA: Diagnosis not present

## 2023-09-02 DIAGNOSIS — L738 Other specified follicular disorders: Secondary | ICD-10-CM | POA: Diagnosis not present

## 2023-09-02 DIAGNOSIS — L820 Inflamed seborrheic keratosis: Secondary | ICD-10-CM | POA: Diagnosis not present

## 2023-09-02 DIAGNOSIS — D2261 Melanocytic nevi of right upper limb, including shoulder: Secondary | ICD-10-CM | POA: Diagnosis not present

## 2023-09-02 DIAGNOSIS — L821 Other seborrheic keratosis: Secondary | ICD-10-CM | POA: Diagnosis not present

## 2023-10-17 DIAGNOSIS — N950 Postmenopausal bleeding: Secondary | ICD-10-CM | POA: Diagnosis not present

## 2023-11-05 DIAGNOSIS — N939 Abnormal uterine and vaginal bleeding, unspecified: Secondary | ICD-10-CM | POA: Diagnosis not present

## 2023-11-25 DIAGNOSIS — N939 Abnormal uterine and vaginal bleeding, unspecified: Secondary | ICD-10-CM | POA: Diagnosis not present

## 2024-01-09 DIAGNOSIS — N939 Abnormal uterine and vaginal bleeding, unspecified: Secondary | ICD-10-CM | POA: Diagnosis not present

## 2024-01-09 DIAGNOSIS — N840 Polyp of corpus uteri: Secondary | ICD-10-CM | POA: Diagnosis not present

## 2024-01-22 ENCOUNTER — Other Ambulatory Visit: Payer: Self-pay | Admitting: Family Medicine

## 2024-01-22 DIAGNOSIS — Z1231 Encounter for screening mammogram for malignant neoplasm of breast: Secondary | ICD-10-CM

## 2024-03-06 ENCOUNTER — Ambulatory Visit
Admission: RE | Admit: 2024-03-06 | Discharge: 2024-03-06 | Disposition: A | Discharge status: Home / Self Care | Source: Ambulatory Visit | Attending: Family Medicine | Admitting: Family Medicine

## 2024-03-06 DIAGNOSIS — Z1231 Encounter for screening mammogram for malignant neoplasm of breast: Secondary | ICD-10-CM | POA: Diagnosis not present
